# Patient Record
Sex: Female | Born: 1975 | Race: White | Hispanic: No | Smoking: Current every day smoker
Health system: Southern US, Community
[De-identification: ages and names within clinical notes are randomized; demographics above are authoritative.]

## PROBLEM LIST (undated history)

## (undated) DIAGNOSIS — F32A Depression, unspecified: Secondary | ICD-10-CM

## (undated) DIAGNOSIS — F1910 Other psychoactive substance abuse, uncomplicated: Secondary | ICD-10-CM

## (undated) DIAGNOSIS — F329 Major depressive disorder, single episode, unspecified: Secondary | ICD-10-CM

## (undated) DIAGNOSIS — F419 Anxiety disorder, unspecified: Secondary | ICD-10-CM

## (undated) HISTORY — DX: Anxiety disorder, unspecified: F41.9

## (undated) HISTORY — DX: Depression, unspecified: F32.A

## (undated) HISTORY — DX: Other psychoactive substance abuse, uncomplicated: F19.10

---
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
## 2019-10-08 ENCOUNTER — Encounter: Payer: Self-pay | Admitting: Physician Assistant

## 2019-10-08 ENCOUNTER — Ambulatory Visit: Payer: Self-pay | Admitting: Physician Assistant

## 2019-10-08 ENCOUNTER — Other Ambulatory Visit: Payer: Self-pay

## 2019-10-08 VITALS — BP 100/60 | HR 87 | Temp 98.8°F | Wt 139.8 lb

## 2019-10-08 DIAGNOSIS — Z7689 Persons encountering health services in other specified circumstances: Secondary | ICD-10-CM

## 2019-10-08 DIAGNOSIS — Z5320 Procedure and treatment not carried out because of patient's decision for unspecified reasons: Secondary | ICD-10-CM

## 2019-10-08 DIAGNOSIS — F39 Unspecified mood [affective] disorder: Secondary | ICD-10-CM

## 2019-10-08 DIAGNOSIS — Z2821 Immunization not carried out because of patient refusal: Secondary | ICD-10-CM

## 2019-10-08 DIAGNOSIS — F1011 Alcohol abuse, in remission: Secondary | ICD-10-CM

## 2019-10-08 DIAGNOSIS — M25562 Pain in left knee: Secondary | ICD-10-CM

## 2019-10-08 NOTE — Patient Instructions (Addendum)
  Get labs from [facility] PAP report from [place] for [place] application for financial  Get meds faxed to our office Juliann Pulse to fax [facility])

## 2019-10-08 NOTE — Progress Notes (Signed)
BP 100/60   Pulse 87   Temp 98.8 F (37.1 C)   Wt 139 lb 12.8 oz (63.4 kg)   SpO2 98%    Subjective:    Patient ID: Amber Estrada, female    DOB: 1976-06-22, 43 y.o.   MRN: [id number]  HPI: Amber Estrada is a 43 y.o. female presenting on 10/08/2019 for New Patient (Initial Visit)   HPI   Patient had a negative COVID-19 screening questionnaire.   Pt at [facility] house since oct 26.  Prior to that she lived in [place] area   She is going to [facility] for mental health issues  She never had mammogram  She says her Last PAP was 4 years ago. It was normal.  She says it was done at [facility] center.    She says she Sometimes has epigastic pain.  She says it is Usually associated with binge drinking.  She has had 6 episodes in past 2 year.  Avoiding eating helps.    Also she is concerned about her knee - she fell off a ladder in July  and hurt the knee.  She got xray was no fx.   Pt staes it's still "not right".   She says activity bothers her a lot.  She says after she has been very active, it will be swollen.   [facility] to [facility] house.   Pt says she is feeling well today.    Relevant past medical, surgical, family and social history reviewed and updated as indicated. Interim medical history since our last visit reviewed. Allergies and medications reviewed and updated.   Current Outpatient Medications:  .  buPROPion (WELLBUTRIN XL) 150 MG 24 hr tablet, Take 150 mg by mouth daily., Disp: , Rfl:  .  OXcarbazepine (TRILEPTAL) 150 MG tablet, Take 150 mg by mouth 3 (three) times daily., Disp: , Rfl:  .  QUEtiapine (SEROQUEL) 50 MG tablet, Take 50 mg by mouth at bedtime., Disp: , Rfl:     Review of Systems  Per HPI unless specifically indicated above     Objective:    BP 100/60   Pulse 87   Temp 98.8 F (37.1 C)   Wt 139 lb 12.8 oz (63.4 kg)   SpO2 98%   Wt Readings from Last 3 Encounters:  10/08/19 139 lb 12.8 oz (63.4 kg)     Physical Exam Vitals signs reviewed.  Constitutional:      General: She is not in acute distress.    Appearance: Normal appearance. She is well-developed. She is not ill-appearing.  HENT:     Head: Normocephalic and atraumatic.  Eyes:     Conjunctiva/sclera: Conjunctivae normal.     Pupils: Pupils are equal, round, and reactive to light.  Neck:     Musculoskeletal: Neck supple.     Thyroid: No thyromegaly.  Cardiovascular:     Rate and Rhythm: Normal rate and regular rhythm.  Pulmonary:     Effort: Pulmonary effort is normal. No respiratory distress.     Breath sounds: Normal breath sounds.  Abdominal:     General: Bowel sounds are normal.     Palpations: Abdomen is soft. There is no mass.     Tenderness: There is no abdominal tenderness.  Musculoskeletal:     Left knee: Normal. She exhibits normal range of motion, no swelling, no effusion, no deformity, normal alignment, no LCL laxity and no MCL laxity. No tenderness found.  Right lower leg: No edema.     Left lower leg: No edema.  Lymphadenopathy:     Cervical: No cervical adenopathy.  Skin:    General: Skin is warm and dry.  Neurological:     Mental Status: She is alert and oriented to person, place, and time.     Gait: Gait normal.  Psychiatric:        Attention and Perception: Attention normal.        Speech: Speech normal.        Behavior: Behavior normal. Behavior is cooperative.     No results found for this or any previous visit.    Assessment & Plan:    Encounter Diagnoses  Name Primary?  . Encounter to establish care Yes  . Left knee pain, unspecified chronicity   . Alcohol abuse, in remission   . Screening mammography declined   . Influenza vaccination declined   . Mood disorder ([place])       Record request sent to Get labs from [facility]  Record request sent to get PAP report from [facility]  Reassured about 6 times/2 years epigastric pains is not sounds like an issue that  needs to be explored at this time.  Particularly as she is currently not having this issue and she is no longer drinking and she reported that her episodes were related to binge drinking  Discussed getting a screening mammogram and pt would like to not get one now and wait to start getting those in the future  Will Refer to orthopedics.  Pt is given application for [employer] financial assistance.  Discussed process and that she will need a bill in order to get approved so, due to that reason alone, an xray of the knee will be ordered.  Pt Declines flu shot  Pt to continue with [facility] for mental health issues  Pt requests to continue taking vitamins but she doesn't know what they are.  She will ask Juliann Pulse (at [facility]) to fax [facility] to our facility and we will continue these for the pt  Pt to have follow up telemedicine appointment in 1 month.  She is to contact office sooner as needed

## 2019-10-09 ENCOUNTER — Ambulatory Visit (HOSPITAL_COMMUNITY)
Admission: RE | Admit: 2019-10-09 | Discharge: 2019-10-09 | Disposition: A | Discharge status: Home / Self Care | Payer: Self-pay | Source: Ambulatory Visit | Attending: Physician Assistant | Admitting: Physician Assistant

## 2019-10-09 ENCOUNTER — Encounter (HOSPITAL_COMMUNITY): Payer: Self-pay | Admitting: Radiology

## 2019-10-09 DIAGNOSIS — M25562 Pain in left knee: Secondary | ICD-10-CM | POA: Insufficient documentation

## 2019-11-09 ENCOUNTER — Ambulatory Visit: Payer: Medicaid Other | Admitting: Physician Assistant

## 2019-11-09 ENCOUNTER — Encounter: Payer: Self-pay | Admitting: Physician Assistant

## 2019-11-09 DIAGNOSIS — K5900 Constipation, unspecified: Secondary | ICD-10-CM

## 2019-11-09 DIAGNOSIS — M25562 Pain in left knee: Secondary | ICD-10-CM

## 2019-11-09 DIAGNOSIS — F17200 Nicotine dependence, unspecified, uncomplicated: Secondary | ICD-10-CM

## 2019-11-09 DIAGNOSIS — F39 Unspecified mood [affective] disorder: Secondary | ICD-10-CM

## 2019-11-09 DIAGNOSIS — F1011 Alcohol abuse, in remission: Secondary | ICD-10-CM

## 2019-11-09 NOTE — Progress Notes (Signed)
There were no vitals taken for this visit.   Subjective:    Patient ID: Amber Estrada, female    DOB: 08-28-1976, 43 y.o.   MRN: [id number]  HPI: Amber Estrada is a 43 y.o. female presenting on 11/09/2019 for No chief complaint on file.   HPI  This is a telemedicine appointment through [employer] due to coronavirus pandemic  I connected with  Amber Estrada on 11/09/19 by a video enabled telemedicine application and verified that I am speaking with the correct person using two identifiers.   I discussed the limitations of evaluation and management by telemedicine. The patient expressed understanding and agreed to proceed.  Pt is still living at [facility] house.  Provider is at office    Pt with history of substance abuse has appointment to day to follow up.  She is still living at [facility] house and is doing well.    She says she will submit her [employer] financial assitance application when she gets her new social security card  She has been referred to orthopedics for her knee.  Reviewed results knee xray with pt.  She says she hasn't heard from orthopedic office yet.    Records were requested.  No records of substance obtained (just a UDS).  Pt says she is doing well and has no new issues except for some mild constipation that she says she never had issue with while she was abusing.  She continues at [facility] for mental health issues.     Relevant past medical, surgical, family and social history reviewed and updated as indicated. Interim medical history since our last visit reviewed. Allergies and medications reviewed and updated.   Current Outpatient Medications:  .  buPROPion (WELLBUTRIN XL) 150 MG 24 hr tablet, Take 150 mg by mouth daily., Disp: , Rfl:  .  OXcarbazepine (TRILEPTAL) 150 MG tablet, Take 150 mg by mouth 3 (three) times daily., Disp: , Rfl:  .  QUEtiapine (SEROQUEL) 50 MG tablet, Take 50 mg by mouth at bedtime., Disp: , Rfl:       Review of Systems  Per HPI  unless specifically indicated above     Objective:    There were no vitals taken for this visit.  Wt Readings from Last 3 Encounters:  10/08/19 139 lb 12.8 oz (63.4 kg)    Physical Exam Constitutional:      General: She is not in acute distress.    Appearance: Normal appearance. She is not ill-appearing.  HENT:     Head: Normocephalic and atraumatic.  Pulmonary:     Effort: Pulmonary effort is normal. No respiratory distress.  Neurological:     Mental Status: She is alert and oriented to person, place, and time.  Psychiatric:        Attention and Perception: Attention normal.        Speech: Speech normal.        Behavior: Behavior is cooperative.     No results found for this or any previous visit.    Assessment & Plan:    Encounter Diagnoses  Name Primary?  . Alcohol abuse, in remission Yes  . Left knee pain, unspecified chronicity   . Mood disorder ([facility])   . Tobacco use disorder   . Constipation, unspecified constipation type     -will  Check cmp- pt will be called with results -pt encouraged to Submit [facility] financial assistance application -pt told that Orthopedics should call her soon.  She is to let us know  if she hasn't heard from them in a week or two -pt to Continue with [facility] for [place] issues -encouraged pt to get plenty of Water for constipation.  Stool softeners prn -pt to continue with [facility] for MH issues -pt to follow up1 year.  She is to contact office sooner prn

## 2019-11-12 ENCOUNTER — Ambulatory Visit: Payer: Self-pay | Admitting: Physician Assistant

## 2019-11-19 ENCOUNTER — Other Ambulatory Visit (HOSPITAL_COMMUNITY)
Admission: RE | Admit: 2019-11-19 | Discharge: 2019-11-19 | Disposition: A | Discharge status: Home / Self Care | Payer: Medicaid Other | Source: Ambulatory Visit | Attending: Physician Assistant | Admitting: Physician Assistant

## 2019-11-19 ENCOUNTER — Other Ambulatory Visit: Payer: Self-pay

## 2019-11-19 ENCOUNTER — Telehealth: Payer: Self-pay | Admitting: Student

## 2019-11-19 DIAGNOSIS — F1011 Alcohol abuse, in remission: Secondary | ICD-10-CM | POA: Insufficient documentation

## 2019-11-19 LAB — COMPREHENSIVE METABOLIC PANEL
ALT: 19 U/L (ref 0–44)
AST: 18 U/L (ref 15–41)
Albumin: 4 g/dL (ref 3.5–5.0)
Alkaline Phosphatase: 46 U/L (ref 38–126)
Anion gap: 9 (ref 5–15)
BUN: 9 mg/dL (ref 6–20)
CO2: 27 mmol/L (ref 22–32)
Calcium: 9.5 mg/dL (ref 8.9–10.3)
Chloride: 102 mmol/L (ref 98–111)
Creatinine, Ser: 0.85 mg/dL (ref 0.44–1.00)
GFR calc Af Amer: >60 mL/min (ref >60)
GFR calc non Af Amer: >60 mL/min (ref >60)
Glucose, Bld: 77 mg/dL (ref 70–99)
Potassium: 4.3 mmol/L (ref 3.5–5.1)
Sodium: 138 mmol/L (ref 135–145)
Total Bilirubin: 0.6 mg/dL (ref 0.3–1.2)
Total Protein: 7 g/dL (ref 6.5–8.1)

## 2019-11-19 NOTE — Telephone Encounter (Signed)
Pt called stating she was advised by PA on 11-09-19 to use stool softeners and increase water for constipation. Pt states she has done that and has now been 8 days since her last BM.  PA suggests for pt to try OTC magnesium citrate. LPN notified pt and Pt verbalizes understanding.  LPN advises pt to call office if no improvements or worsening symptoms. Pt verbalized understanding.

## 2020-10-03 ENCOUNTER — Ambulatory Visit: Payer: Medicaid Other | Admitting: Physician Assistant

## 2021-03-18 IMAGING — DX DG KNEE 1-2V*L*
2 series · 2 of 2 positions shown · non-contrast
Comparison: None

CLINICAL DATA: Fell from a ladder in [REDACTED] hyperextending LEFT knee,
continued LEFT knee pain with no additional injury

EXAM:
LEFT KNEE - 1-2 VIEW

[knee ap]
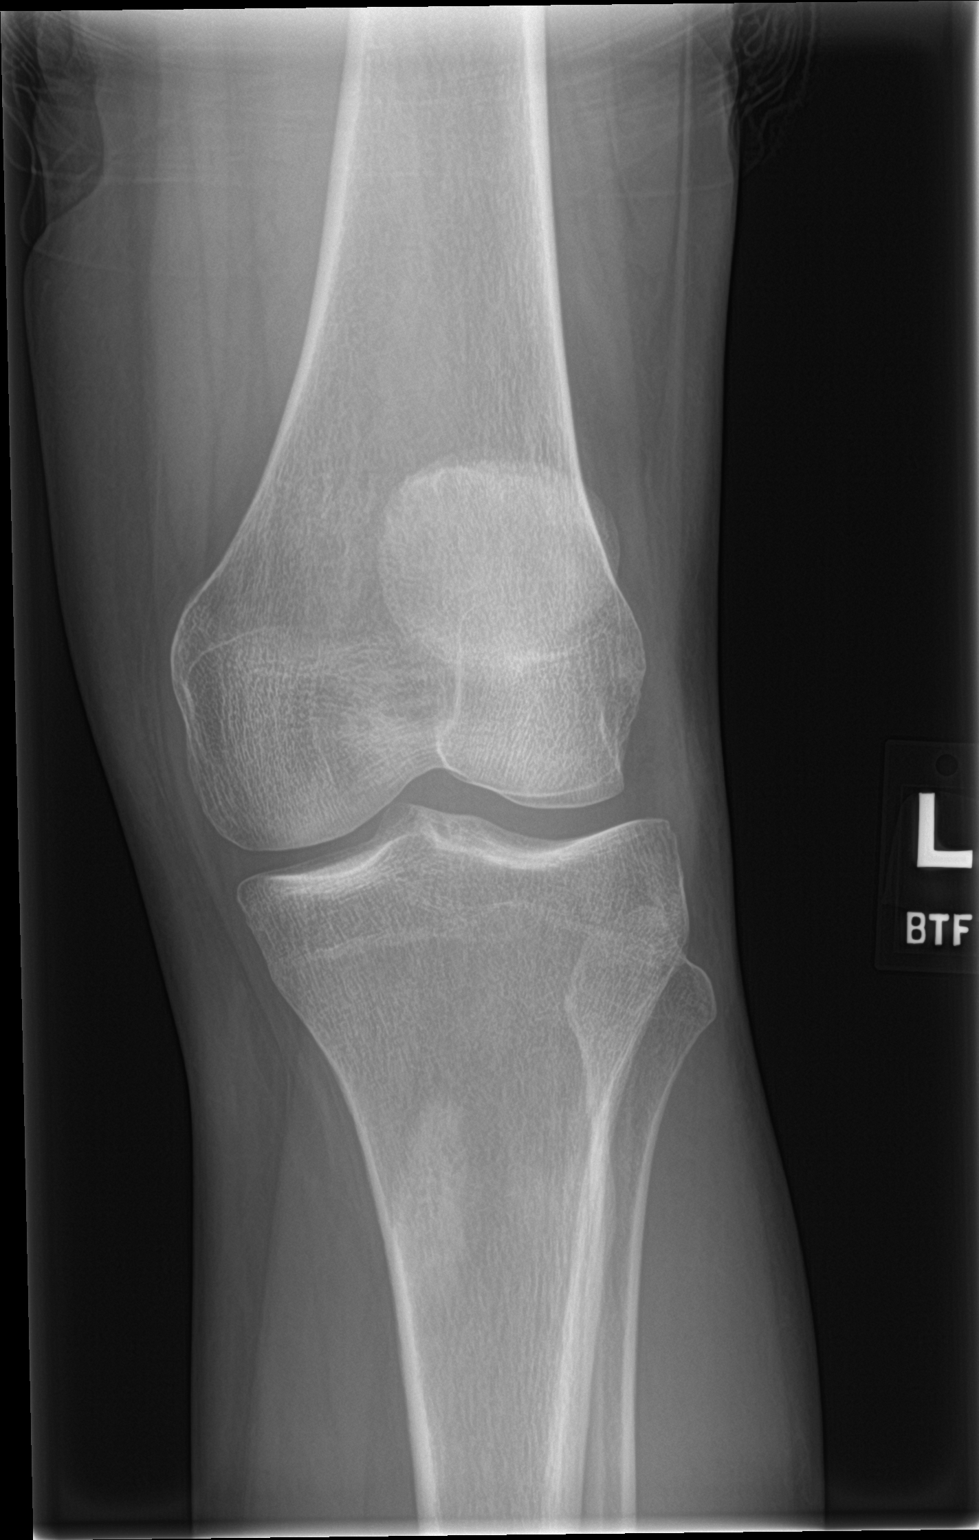

[knee lat]
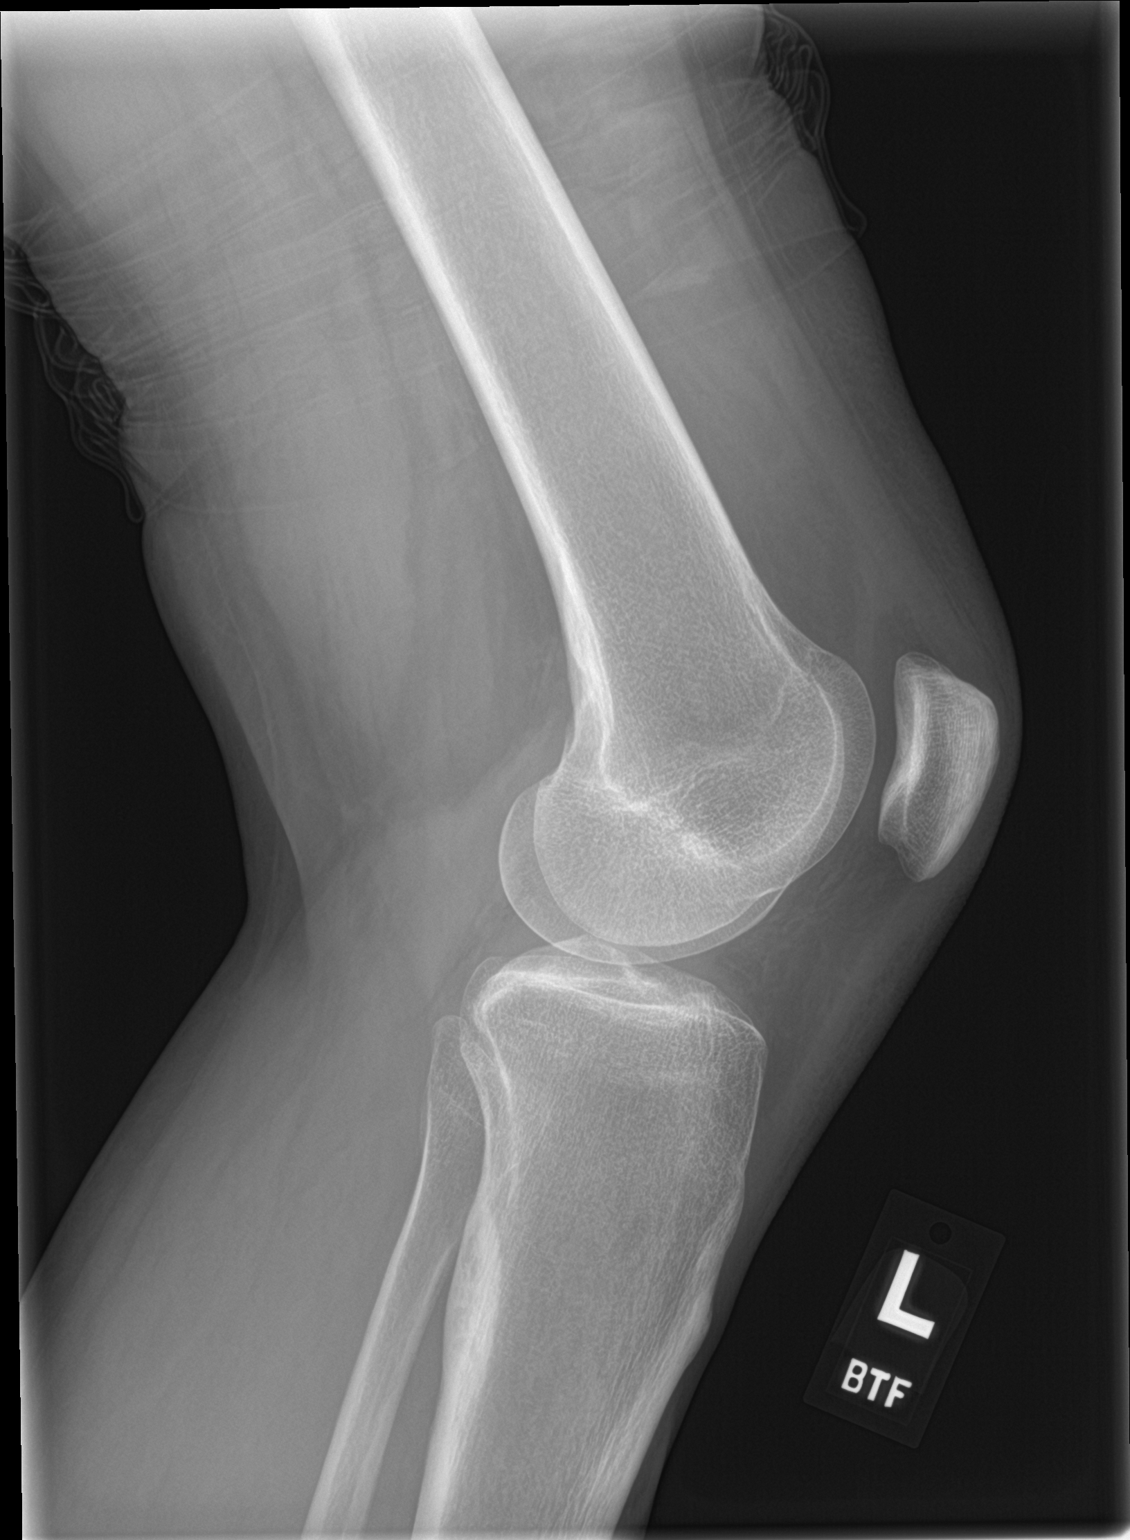

[2 of 2 positions shown; findings below may reference images not displayed]

FINDINGS: Osseous mineralization normal.

Minimal medial compartment joint space narrowing.

Remaining joint spaces preserved.

No acute fracture in a, dislocation, or bone destruction.

No knee joint effusion.
IMPRESSION: Minimal degenerative change at medial compartment.

No acute abnormalities.
